# Patient Record
Sex: Male | Born: 1990 | Race: Black or African American | Hispanic: No | Smoking: Current every day smoker
Health system: Southern US, Community
[De-identification: ages and names within clinical notes are randomized; demographics above are authoritative.]

## PROBLEM LIST (undated history)

## (undated) DIAGNOSIS — K469 Unspecified abdominal hernia without obstruction or gangrene: Secondary | ICD-10-CM

## (undated) HISTORY — PX: INGUINAL HERNIA REPAIR: SUR1180

---
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
## 2009-02-19 ENCOUNTER — Emergency Department (HOSPITAL_COMMUNITY): Admission: EM | Admit: 2009-02-19 | Discharge: 2009-02-19 | Payer: Self-pay | Admitting: Emergency Medicine

## 2010-01-28 ENCOUNTER — Emergency Department (HOSPITAL_COMMUNITY): Admission: EM | Admit: 2010-01-28 | Discharge: 2010-01-28 | Payer: Self-pay | Admitting: Emergency Medicine

## 2010-11-09 LAB — RAPID STREP SCREEN (MED CTR MEBANE ONLY): Streptococcus, Group A Screen (Direct): NEGATIVE

## 2011-01-05 NOTE — Op Note (Signed)
NAMEARVILLE, POSTLEWAITE NO.:  [id number]   MEDICAL RECORD NO.:  [id number]          PATIENT TYPE:  AMB   LOCATION:  DSC                          FACILITY:  [facility]   PHYSICIAN:  Currie Paris, M.D.DATE OF BIRTH:  [birth year]   DATE OF PROCEDURE:  07/10/2008  DATE OF DISCHARGE:                               OPERATIVE REPORT   MEDICAL RECORD NUMBER [id number] [phone].   PREOPERATIVE DIAGNOSIS:  Possible myositis.   POSTOPERATIVE DIAGNOSIS:  Possible myositis.   OPERATION:  Left thigh (vastus lateralis), muscle biopsy.   SURGEON:  Currie Paris, MD   ANESTHESIA:  MAC.   CLINICAL HISTORY:  This is an 20 year old who is being evaluated for a  possible myositis.  He was requested to have a muscle biopsy to help  with diagnostic studies.   DESCRIPTION OF PROCEDURE:  The patient and his mother was seen in the  holding area and they had no further questions.  We identified and  marked the left thigh as the operative site.   The patient was taken to the operating room, and after satisfactory IV  sedation, the left thigh was prepped and draped as a sterile field.  The  time-out was done.   I infiltrated a combination of 1% Xylocaine mixed with 0.5% Marcaine  with epi into the skin and subcutaneous tissues but tried to not put any  deeper.  I made a longitudinal incision.  I identified the fascia and  opened this and then took 3 separate segments of muscle from the thigh.  Grossly, the muscle appeared normal.  These were all tacked to some  tongue blades and sent directly to pathology.  I had already spoken to  Dr. Clelia Croft prior to doing the biopsy.   I then put some more local into the muscle to help with postop pain  relief and closed with 2-0 Vicryl to close the fascia, 3-0 Vicryl subcu,  and 3-0 Monocryl subcuticular plus Dermabond.   The patient tolerated the procedure well and there were no  complications.      Currie Paris, M.D.  Electronically  Signed     CJS/MEDQ  D:  07/10/2008  T:  07/11/2008  Job:  [id number]   cc:   Dario Guardian, M.D.  Levert Feinstein, MD

## 2011-10-26 ENCOUNTER — Encounter (HOSPITAL_COMMUNITY): Payer: Self-pay | Admitting: *Deleted

## 2011-10-26 ENCOUNTER — Emergency Department (HOSPITAL_COMMUNITY)
Admission: EM | Admit: 2011-10-26 | Discharge: 2011-10-26 | Disposition: A | Discharge status: Home / Self Care | Payer: Self-pay | Attending: Emergency Medicine | Admitting: Emergency Medicine

## 2011-10-26 DIAGNOSIS — IMO0001 Reserved for inherently not codable concepts without codable children: Secondary | ICD-10-CM | POA: Insufficient documentation

## 2011-10-26 DIAGNOSIS — G40909 Epilepsy, unspecified, not intractable, without status epilepticus: Secondary | ICD-10-CM | POA: Insufficient documentation

## 2011-10-26 DIAGNOSIS — K0889 Other specified disorders of teeth and supporting structures: Secondary | ICD-10-CM

## 2011-10-26 DIAGNOSIS — K089 Disorder of teeth and supporting structures, unspecified: Secondary | ICD-10-CM | POA: Insufficient documentation

## 2011-10-26 DIAGNOSIS — G8929 Other chronic pain: Secondary | ICD-10-CM | POA: Insufficient documentation

## 2011-10-26 MED ORDER — BUPIVACAINE HCL (PF) 0.25 % IJ SOLN
5.0000 mL | Freq: Once | INTRAMUSCULAR | Status: AC
  Administered 2011-10-26: 5 mL
  Filled 2011-10-26: qty 30

## 2011-10-26 MED ORDER — HYDROCODONE-ACETAMINOPHEN 5-325 MG PO TABS: 1.0000 | ORAL_TABLET | ORAL | Status: AC | PRN

## 2011-10-26 NOTE — ED Notes (Signed)
Went in to d/c pt-pt's mother reports that the dentist's office cannot see pt until tomorrow am at 0900-she is requesting for pain med for pt "to get him through the night."

## 2011-10-26 NOTE — ED Provider Notes (Signed)
History     CSN: [id number]  Arrival date & time 10/26/11  1258   First MD Initiated Contact with Patient 10/26/11 1302      Chief Complaint  Patient presents with  . Dental Pain    (Consider location/radiation/quality/duration/timing/severity/associated sxs/prior treatment) HPI History provided by pt.   Pt has had severe, left lower toothache for several days, since a piece broke off.  Has vicodin at home for chronic myalgias/migraines and has had some relief with this medication.  No associated fever.  His mother would like a referral to the dentist.    History reviewed. No pertinent past medical history.  History reviewed. No pertinent past surgical history.  No family history on file.  History  Substance Use Topics  . Smoking status: Not on file  . Smokeless tobacco: Not on file  . Alcohol Use: No      Review of Systems  All other systems reviewed and are negative.    Allergies  Review of patient's allergies indicates not on file.  Home Medications  No current outpatient prescriptions on file.  There were no vitals taken for this visit.  Physical Exam  Nursing note and vitals reviewed. Constitutional: He is oriented to person, place, and time. He appears well-developed and well-nourished.  HENT:  Head: Normocephalic and atraumatic. No trismus in the jaw.  Mouth/Throat: Uvula is midline and oropharynx is clear and moist.       Left lower first molar w/ small posterior avulsion.  Non-tender.  Adjacent gingiva appears normal.  Good dentition.   Eyes:       Normal appearance  Neck: Normal range of motion. Neck supple.  Lymphadenopathy:    He has no cervical adenopathy.  Neurological: He is alert and oriented to person, place, and time.  Psychiatric: He has a normal mood and affect. His behavior is normal.    ED Course  Dental Date/Time: 10/26/2011 8:14 PM Performed by: Otilio Miu Authorized by: Ruby Cola E Consent: Verbal  consent obtained. Consent given by: patient and parent Patient understanding: patient states understanding of the procedure being performed Local anesthesia used: yes Local anesthetic: bupivacaine 0.25% without epinephrine Anesthetic total: 5 ml Patient tolerance: Patient tolerated the procedure well with no immediate complications. Comments: Apical block (left lower first molar)   (including critical care time)  Labs Reviewed - No data to display No results found.   1. Toothache       MDM  Pt presents w/ partial avulsion and pain of left lower first molar.  Low clinical suspicion for dental infection.  Apical block performed at patient's request.  Pt's mother called dentist on call from ED and is scheduled to be seen tomorrow at 9am.  D/c'd home w/ 5 vicodin to get him through until appt.          Otilio Miu, [place] 10/26/11 2020

## 2011-10-26 NOTE — Discharge Instructions (Signed)
Follow up with Dr. Melynda Ripple as soon as possible.   You may return to the ER if symptoms worsen or you have any other concerns.   Dental Pain A tooth ache may be caused by cavities (tooth decay). Cavities expose the nerve of the tooth to air and hot or cold temperatures. It may come from an infection or abscess (also called a boil or furuncle) around your tooth. It is also often caused by dental caries (tooth decay). This causes the pain you are having. DIAGNOSIS  Your caregiver can diagnose this problem by exam. TREATMENT   If caused by an infection, it may be treated with medications which kill germs (antibiotics) and pain medications as prescribed by your caregiver. Take medications as directed.   Only take over-the-counter or prescription medicines for pain, discomfort, or fever as directed by your caregiver.   Whether the tooth ache today is caused by infection or dental disease, you should see your dentist as soon as possible for further care.  SEEK MEDICAL CARE IF: The exam and treatment you received today has been provided on an emergency basis only. This is not a substitute for complete medical or dental care. If your problem worsens or new problems (symptoms) appear, and you are unable to meet with your dentist, call or return to this location. SEEK IMMEDIATE MEDICAL CARE IF:   You have a fever.   You develop redness and swelling of your face, jaw, or neck.   You are unable to open your mouth.   You have severe pain uncontrolled by pain medicine.  MAKE SURE YOU:   Understand these instructions.   Will watch your condition.   Will get help right away if you are not doing well or get worse.  Document Released: 08/09/2005 Document Revised: 07/29/2011 Document Reviewed: 03/27/2008 [facility] Patient Information 2012 [place], [place].

## 2011-10-26 NOTE — ED Notes (Signed)
Kim case mgr at bedside to speak to pt and  mother

## 2011-10-26 NOTE — ED Notes (Signed)
Pt's mother came to the desk, asking for ice d/t pt having "mouth" pain.  She went back in the room, then after a few minutes she comes back out of the room at the desk.  States "can i have that ice?"  She's made aware that per the ED's policy, pt are not allowed to have something to eat or drink until they are seen by a provider.  Then she goes out in to waiting area, and as she was walking out she said "well im just gonna go get him some water then."  At this time, pt came out of the room, states  "i don't need water i just need ice."  Explained to pt the ED's policy.  Pt's mother walked back in with a bottle of water.

## 2011-10-26 NOTE — ED Notes (Signed)
Pt here for dental pain.  Pt's mother reports that she was given a referral, called the office but was instructed that for the Dentist to see pt, that he has to come to the ED.

## 2011-10-27 NOTE — ED Provider Notes (Signed)
Medical screening examination/treatment/procedure(s) were performed by non-physician practitioner and as supervising physician I was immediately available for consultation/collaboration.  Doug Sou, MD 10/27/11 [phone]

## 2012-01-11 ENCOUNTER — Emergency Department (HOSPITAL_COMMUNITY): Payer: Self-pay

## 2012-01-11 ENCOUNTER — Emergency Department (HOSPITAL_COMMUNITY)
Admission: EM | Admit: 2012-01-11 | Discharge: 2012-01-11 | Disposition: A | Discharge status: Home / Self Care | Payer: Self-pay | Attending: Emergency Medicine | Admitting: Emergency Medicine

## 2012-01-11 ENCOUNTER — Encounter (HOSPITAL_COMMUNITY): Payer: Self-pay

## 2012-01-11 DIAGNOSIS — N453 Epididymo-orchitis: Secondary | ICD-10-CM | POA: Insufficient documentation

## 2012-01-11 DIAGNOSIS — N451 Epididymitis: Secondary | ICD-10-CM

## 2012-01-11 MED ORDER — DOXYCYCLINE HYCLATE 100 MG PO CAPS: 100.0000 mg | ORAL_CAPSULE | Freq: Two times a day (BID) | ORAL | Status: AC

## 2012-01-11 MED ORDER — IBUPROFEN 600 MG PO TABS: 600.0000 mg | ORAL_TABLET | Freq: Three times a day (TID) | ORAL | Status: AC | PRN

## 2012-01-11 MED ORDER — CEFTRIAXONE SODIUM 250 MG IJ SOLR
250.0000 mg | Freq: Once | INTRAMUSCULAR | Status: AC
  Administered 2012-01-11: 250 mg via INTRAMUSCULAR
  Filled 2012-01-11: qty 250

## 2012-01-11 MED ORDER — DOXYCYCLINE HYCLATE 100 MG PO TABS
100.0000 mg | ORAL_TABLET | Freq: Once | ORAL | Status: AC
  Administered 2012-01-11: 100 mg via ORAL
  Filled 2012-01-11: qty 1

## 2012-01-11 MED ORDER — LIDOCAINE HCL 1 % IJ SOLN
INTRAMUSCULAR | Status: AC
  Administered 2012-01-11: 16:00:00
  Filled 2012-01-11: qty 20

## 2012-01-11 MED ORDER — IBUPROFEN 200 MG PO TABS
600.0000 mg | ORAL_TABLET | Freq: Once | ORAL | Status: AC
  Administered 2012-01-11: 600 mg via ORAL
  Filled 2012-01-11: qty 3

## 2012-01-11 NOTE — ED Notes (Signed)
Patient transported to Ultrasound 

## 2012-01-11 NOTE — ED Notes (Signed)
Pt presents with no acute distress.  Pt reports sitting on right testicle wrong and then several days later a bump appears "on the inside. Very hard" denies N/V/D and fever

## 2012-01-11 NOTE — ED Provider Notes (Signed)
History     CSN: [id number]  Arrival date & time 01/11/12  1329   First MD Initiated Contact with Patient 01/11/12 1332      Chief Complaint  Patient presents with  . Testicle Pain     The history is provided by the patient.   the patient reports several days of right-sided testicular tenderness and pain.  He thinks he sat on his testicle and it began hurting after this.  He denies discharge from his penis.  He denies nausea vomiting or diarrhea.  He has had no new lesions on his testicles or penis.  Denies new sexual contacts. No dysuria. No fever or chills. Pain is mild.    History reviewed. No pertinent past medical history.  History reviewed. No pertinent past surgical history.  No family history on file.  History  Substance Use Topics  . Smoking status: Not on file  . Smokeless tobacco: Not on file  . Alcohol Use: No      Review of Systems  All other systems reviewed and are negative.    Allergies  Bee venom  Home Medications   Current Outpatient Rx  Name Route Sig Dispense Refill  . EPINEPHRINE 0.3 MG/0.3ML IJ DEVI Intramuscular Inject 0.3 mg into the muscle once.    Marland Kitchen DOXYCYCLINE HYCLATE 100 MG PO CAPS Oral Take 1 capsule (100 mg total) by mouth 2 (two) times daily. 20 capsule 0  . IBUPROFEN 600 MG PO TABS Oral Take 1 tablet (600 mg total) by mouth every 8 (eight) hours as needed for pain. 15 tablet 0    BP 114/58  Pulse 58  Temp(Src) 98.4 F (36.9 C) (Oral)  Resp 16  Ht 5\' 5"  (1.651 m)  Wt 147 lb (66.679 kg)  BMI 24.46 kg/m2  SpO2 100%  Physical Exam  Nursing note and vitals reviewed. Constitutional: He is oriented to person, place, and time. He appears well-developed and well-nourished.  HENT:  Head: Normocephalic and atraumatic.  Eyes: EOM are normal.  Neck: Normal range of motion.  Cardiovascular: Normal rate, regular rhythm, normal heart sounds and intact distal pulses.   Pulmonary/Chest: Effort normal and breath sounds normal. No  respiratory distress.  Abdominal: Soft. He exhibits no distension. There is no tenderness.  Genitourinary:       Normal circumcised penis.  Normal lie of his testicles bilaterally.  Scrotum is normal without erythema or tenderness.  There does appear to be some tenderness of his right testicle with tenderness at the inferior posterior aspect of the testicle.  No left-sided testicular tenderness  Musculoskeletal: Normal range of motion.  Neurological: He is alert and oriented to person, place, and time.  Skin: Skin is warm and dry.  Psychiatric: He has a normal mood and affect. Judgment normal.    ED Course  Procedures (including critical care time)  Labs Reviewed - No data to display US Scrotum  01/11/2012  *RADIOLOGY REPORT*  Clinical Data:  Testicular pain after sitting on his testicles 3 days ago.  Evaluate for potential portion.  SCROTAL ULTRASOUND DOPPLER ULTRASOUND OF THE TESTICLES  Technique: Complete ultrasound examination of the testicles, epididymis, and other scrotal structures was performed.  Color and spectral Doppler ultrasound were also utilized to evaluate blood flow to the testicles.  Comparison:  No priors.  Findings:  Right testis:  4.8 x 2.3 x 3.1 cm demonstrating normal echotexture and appearance, with normal flow on color Doppler imaging.  Left testis:  4.8 x 2.0 x 3.8 cm demonstrating  normal echotexture and appearance, with normal flow on color Doppler imaging.  Right epididymis:  Mild diffuse prominence, however, the talus focally enlarged and hyperemic.  This focal enlargement of the tail of the right epididymis corresponds with the patients palpable abnormality and the site of patient's acute pain.  Left epididymis:  Normal in size and appearance.  Hydocele:  Trace bilateral.  Varicocele:  Absent.  Pulsed Doppler interrogation of both testes demonstrates low resistance flow bilaterally.  IMPRESSION: 1.  Findings, as above, most consistent with right-sided epididymitis  involving predominately the tail of the right epididymis. 2.  Trace bilateral hydroceles.  Original Report Authenticated By: Florencia Reasons, M.D.   [place] Art/ven Flow Abd Pelv Doppler  01/11/2012  *RADIOLOGY REPORT*  Clinical Data:  Testicular pain after sitting on his testicles 3 days ago.  Evaluate for potential portion.  SCROTAL ULTRASOUND DOPPLER ULTRASOUND OF THE TESTICLES  Technique: Complete ultrasound examination of the testicles, epididymis, and other scrotal structures was performed.  Color and spectral Doppler ultrasound were also utilized to evaluate blood flow to the testicles.  Comparison:  No priors.  Findings:  Right testis:  4.8 x 2.3 x 3.1 cm demonstrating normal echotexture and appearance, with normal flow on color Doppler imaging.  Left testis:  4.8 x 2.0 x 3.8 cm demonstrating normal echotexture and appearance, with normal flow on color Doppler imaging.  Right epididymis:  Mild diffuse prominence, however, the talus focally enlarged and hyperemic.  This focal enlargement of the tail of the right epididymis corresponds with the patients palpable abnormality and the site of patient's acute pain.  Left epididymis:  Normal in size and appearance.  Hydocele:  Trace bilateral.  Varicocele:  Absent.  Pulsed Doppler interrogation of both testes demonstrates low resistance flow bilaterally.  IMPRESSION: 1.  Findings, as above, most consistent with right-sided epididymitis involving predominately the tail of the right epididymis. 2.  Trace bilateral hydroceles.  Original Report Authenticated By: Florencia Reasons, M.D.     1. Epididymitis       MDM  Ultrasound consistent with epididymitis.  Patient's been given Rocephin and Doxy.  He's been informed to followup at the health department for additional STD screening.  She's been informed to not have intercourse with anyone until all of his sexual partners evaluated and treated and is suspicious antibiotics.  He is encouraged use condoms for  protection        Lyanne Co, MD 01/11/12 [phone]

## 2012-01-11 NOTE — Discharge Instructions (Signed)
Epididymitis  Epididymitis is a swelling (inflammation) of the epididymis. The epididymis is a cord-like structure along the back part of the testicle. Epididymitis is usually, but not always, caused by infection. This is usually a sudden problem beginning with chills, fever and pain behind the scrotum and in the testicle. There may be swelling and redness of the testicle.  DIAGNOSIS   Physical examination will reveal a tender, swollen epididymis. Sometimes, cultures are obtained from the urine or from prostate secretions to help find out if there is an infection or if the cause is a different problem. Sometimes, blood work is performed to see if your white blood cell count is elevated and if a germ (bacterial) or viral infection is present. Using this knowledge, an appropriate medicine which kills germs (antibiotic) can be chosen by your caregiver. A viral infection causing epididymitis will most often go away (resolve) without treatment.  HOME CARE INSTRUCTIONS    Hot sitz baths for 20 minutes, 4 times per day, may help relieve pain.   Only take over-the-counter or prescription medicines for pain, discomfort or fever as directed by your caregiver.   Take all medicines, including antibiotics, as directed. Take the antibiotics for the full prescribed length of time even if you are feeling better.   It is very important to keep all follow-up appointments.  SEEK IMMEDIATE MEDICAL CARE IF:    You have a fever.   You have pain not relieved with medicines.   You have any worsening of your problems.   Your pain seems to come and go.   You develop pain, redness, and swelling in the scrotum and surrounding areas.  MAKE SURE YOU:    Understand these instructions.   Will watch your condition.   Will get help right away if you are not doing well or get worse.  Document Released: 08/06/2000 Document Revised: 07/29/2011 Document Reviewed: 06/26/2009  [employer] Patient Information 2012 [employer].

## 2012-01-11 NOTE — ED Notes (Signed)
Returned from U/S

## 2012-02-17 ENCOUNTER — Emergency Department (HOSPITAL_COMMUNITY): Payer: Self-pay

## 2012-02-17 ENCOUNTER — Emergency Department (HOSPITAL_COMMUNITY)
Admission: EM | Admit: 2012-02-17 | Discharge: 2012-02-17 | Disposition: A | Discharge status: Home / Self Care | Payer: Self-pay | Attending: Emergency Medicine | Admitting: Emergency Medicine

## 2012-02-17 ENCOUNTER — Encounter (HOSPITAL_COMMUNITY): Payer: Self-pay | Admitting: Emergency Medicine

## 2012-02-17 DIAGNOSIS — N451 Epididymitis: Secondary | ICD-10-CM

## 2012-02-17 DIAGNOSIS — N453 Epididymo-orchitis: Secondary | ICD-10-CM | POA: Insufficient documentation

## 2012-02-17 DIAGNOSIS — N509 Disorder of male genital organs, unspecified: Secondary | ICD-10-CM | POA: Insufficient documentation

## 2012-02-17 HISTORY — DX: Unspecified abdominal hernia without obstruction or gangrene: K46.9

## 2012-02-17 LAB — URINALYSIS, ROUTINE W REFLEX MICROSCOPIC
Bilirubin Urine: NEGATIVE
Glucose, UA: NEGATIVE mg/dL
Hgb urine dipstick: NEGATIVE
Ketones, ur: NEGATIVE mg/dL
Nitrite: NEGATIVE
Protein, ur: NEGATIVE mg/dL
Specific Gravity, Urine: 1.028 (ref 1.005–1.030)
Urobilinogen, UA: 1 mg/dL (ref 0.0–1.0)
pH: 6 (ref 5.0–8.0)

## 2012-02-17 LAB — URINE MICROSCOPIC-ADD ON

## 2012-02-17 MED ORDER — LIDOCAINE HCL 1 % IJ SOLN
INTRAMUSCULAR | Status: AC
  Filled 2012-02-17: qty 20

## 2012-02-17 MED ORDER — CEFTRIAXONE SODIUM 250 MG IJ SOLR
250.0000 mg | Freq: Once | INTRAMUSCULAR | Status: AC
  Administered 2012-02-17: 250 mg via INTRAMUSCULAR
  Filled 2012-02-17: qty 250

## 2012-02-17 MED ORDER — DOXYCYCLINE HYCLATE 100 MG PO CAPS: 100.0000 mg | ORAL_CAPSULE | Freq: Two times a day (BID) | ORAL | Status: AC

## 2012-02-17 NOTE — ED Notes (Signed)
2 day hx of swollen r/testicle. Similar incident 1 month ago

## 2012-02-17 NOTE — Discharge Instructions (Signed)
Epididymitis Epididymitis is a swelling (inflammation) of the epididymis. The epididymis is a cord-like structure along the back part of the testicle. Epididymitis is usually, but not always, caused by infection. This is usually a sudden problem beginning with chills, fever and pain behind the scrotum and in the testicle. There may be swelling and redness of the testicle. DIAGNOSIS  Physical examination will reveal a tender, swollen epididymis. Sometimes, cultures are obtained from the urine or from prostate secretions to help find out if there is an infection or if the cause is a different problem. Sometimes, blood work is performed to see if your white blood cell count is elevated and if a germ (bacterial) or viral infection is present. Using this knowledge, an appropriate medicine which kills germs (antibiotic) can be chosen by your caregiver. A viral infection causing epididymitis will most often go away (resolve) without treatment. HOME CARE INSTRUCTIONS   Hot sitz baths for 20 minutes, 4 times per day, may help relieve pain.   Only take over-the-counter or prescription medicines for pain, discomfort or fever as directed by your caregiver.   Take all medicines, including antibiotics, as directed. Take the antibiotics for the full prescribed length of time even if you are feeling better.   It is very important to keep all follow-up appointments.  SEEK IMMEDIATE MEDICAL CARE IF:   You have a fever.   You have pain not relieved with medicines.   You have any worsening of your problems.   Your pain seems to come and go.   You develop pain, redness, and swelling in the scrotum and surrounding areas.   Testicular Masses Most testicular masses, such as a growth or a swelling, are benign. This means they are not cancerous. Common types of testicular masses include:   Hydrocele is the most common benign testicular mass in an adult. Hydroceles are generally soft, painless scrotal swellings  that are collections of fluid in the scrotal sac. These can rapidly change size as the fluid enters or leaves.   Spermatoceles are generally soft, painless, benign swellings that are cyst-like masses in the scrotum containing fluid. They can rapidly change size as the fluid enters or leaves. They are more prominent while standing or exercising. Sometimes, spermatoceles may cause a sensation of heaviness or a dull ache.   Varicocele is an enlargement of the veins that drain the testicles. This condition can increase the risk of infertility. They are more prominent while standing or exercising. Sometimes, varicoceles may cause a sensation of heaviness or a dull ache.   Inguinal hernia is a bulge caused by a portion of intestine protruding into the scrotum through a weak area in the abdominal muscles. Hernias may or may not be painful. They are soft and usually enlarge with coughing or straining.   Torsion of the testis can cause a testicular mass that develops quickly and is associated with tenderness and/or fever. This is caused by a twisting of the testicle within the sac. It also reduces the blood supply and can destroy the testis if not treated quickly with surgery.   Epididymitis is inflammation of the epididymis (a structure attached to the testicle), usually caused by a sexually transmitted infection or a urinary tract infection. This generally shows up as testicular discomfort and swelling, and may include pain during urination.   Testicular appendages are remnants of tissue on the testis present since birth. A testicular appendage can twist on its blood supply and cause pain. In most cases, this is  seen as a blue dot on the scrotum.  A cancerous growth in the scrotum may first appear as a swelling. There may or may not be pain. The growth usually feels firm and shows up as a growth on the testicle. Any solid, firm growth in a testicle is considered cancer until proven otherwise. Cancer of the  testicle most commonly affects men 85 to 22 years old. Risk factors include prior testicular tumor and cryptorchidism (undescended testis). Occasionally, testicular cancer may appear with symptoms (problems) of metastasis. This means the tumor (abnormal growth) has spread and is causing other problems that may include cough, shortness of breath or weight loss. Monthly testicular self-exams are recommended for all men. Get in the habit of examining your own testicles. A good time is while taking a shower. Get to know what your testicles feel like so you will know if there is a new growth or change in them. DIAGNOSIS  See your caregiver if you feel a growth in your testicle. Sometimes, all that is needed to make the diagnosis (determine what is wrong) is a physical exam. Your caregiver may shine a bright light through the scrotum to help make the diagnosis. This is called transillumination. The light will shine easily through a collection of fluid but will usually not shine through a tumor. Other testing, including blood tests and an ultrasound exam, may be done. An ultrasound exam bounces harmless sound waves off the testicles and produces a black and white picture almost like that produced by a camera. Diagnosis of testicular cancer can be made by measuring several substances in the blood, called markers), that may indicate the presence of certain cancers. TREATMENT  What is wrong determines how it is treated. Small hydroceles and spermatoceles often require no treatment. In some cases, however, they may be treated surgically. Hernias are repaired with surgery. Because epididymitis is usually caused by an infection, it is usually treated with antibiotics. Varicoceles may be treated by surgery to tie off the affected veins. Testicular cancer treatment depends upon the type of cancer. Sometimes, some tissue is removed surgically as a way of trying to preserve the testicle but if a tumor is suspected, the preferred  treatment is removal of the entire testicle. Further treatment may include watching the growth with strict follow-up, chemotherapy or radiation. If a growth has been found in a testicle, your caregiver will help you determine the best treatment. Document Released: 02/13/2003 Document Revised: 07/29/2011 Document Reviewed: 08/09/2005 [facility] Patient Information 2012 [place], [place].  Understand these instructions.   Will watch your condition.   Will get help right away if you are not doing well or get worse.  Document Released: 08/06/2000 Document Revised: 07/29/2011 Document Reviewed: 06/26/2009 [facility] Patient Information 2012 [place], [place].

## 2012-02-17 NOTE — ED Provider Notes (Signed)
History    21yM with R testicular pain and mass. Evaluated 5/24 and had [place] consistent with R epididymitis. Per that note was tx'd with ceftriaxone and given prescription for doxy. Pt says never got rocephin and didn't fill doxycycline because could afford it. Intermittent R testicular pain and swelling since. No abdominal or back pain. No discharge. No urinary complaints. Pt says was at [facility] today and they told him it wasn't an STD and to come to ER. Mother at bedside and concerned about multiple. Apparently had R inguinal hernia repair as child and concerned about re-occurence. Concerned about possible malignancy. "It ain't no STD because they just test him and told him it wasn't."    CSN: [id number]  Arrival date & time 02/17/12  1356   First MD Initiated Contact with Patient 02/17/12 1458      Chief Complaint  Patient presents with  . Groin Swelling    pt was seen in  [facility] department clinic this am and refered to ED for swollen right testicle  . Testicle Pain    (Consider location/radiation/quality/duration/timing/severity/associated sxs/prior treatment) HPI  Past Medical History  Diagnosis Date  . Hernia     Past Surgical History  Procedure Date  . Inguinal hernia repair     Family History  Problem Relation Age of Onset  . Diabetes Father   . Hypertension Father     History  Substance Use Topics  . Smoking status: Not on file  . Smokeless tobacco: Not on file  . Alcohol Use: No      Review of Systems   Review of symptoms negative unless otherwise noted in HPI.   Allergies  Bee venom  Home Medications   Current Outpatient Rx  Name Route Sig Dispense Refill  . FLUTICASONE PROPIONATE 50 MCG/ACT NA SUSP Nasal Place 2 sprays into the nose daily.    Marland Kitchen HYDROCODONE-ACETAMINOPHEN 7.5-325 MG PO TABS Oral Take 1 tablet by mouth every 6 (six) hours as needed.    . OLOPATADINE HCL 0.1 % OP SOLN Both Eyes Place 1 drop into both eyes 2  (two) times daily.    Marland Kitchen EPINEPHRINE 0.3 MG/0.3ML IJ DEVI Intramuscular Inject 0.3 mg into the muscle once.      BP 124/69  Pulse 70  Temp 98.2 F (36.8 C) (Oral)  Resp 18  SpO2 100%  Physical Exam  Nursing note and vitals reviewed. Constitutional: He appears well-developed and well-nourished. No distress.  HENT:  Head: Normocephalic and atraumatic.  Eyes: Conjunctivae are normal. Right eye exhibits no discharge. Left eye exhibits no discharge.  Neck: Neck supple.  Cardiovascular: Normal rate, regular rhythm and normal heart sounds.  Exam reveals no gallop and no friction rub.   No murmur heard. Pulmonary/Chest: Effort normal and breath sounds normal. No respiratory distress.  Abdominal: Soft. He exhibits no distension. There is no tenderness.  Genitourinary:       Normal external male genitalia. No skin lesions noted. No inguinal adenopathy. No discharge. Normal lie of testicles. R testicle with 1.5cm firm, mildly tender mass inferior aspect. No scrotal edema. No inguinal hernia b/l.  Musculoskeletal: He exhibits no edema and no tenderness.  Neurological: He is alert.  Skin: Skin is warm and dry.  Psychiatric: He has a normal mood and affect. His behavior is normal. Thought content normal.    ED Course  Procedures (including critical care time)  Labs Reviewed - No data to display No results found.   1. Epididymitis, right  MDM  21yM with mildy painful R testicular mass. Declining STD testing because "they just did that." More than likely persistent untreated epididymitis. Will [place] again more or less to appease mother and pt.    4:50 PM [place] again consistent with epididymitis. Pt provided with coupon for 2w supply of doxycycline at [facility] for $19.08. Dose ceftriaxone given.        Raeford Razor, MD 02/17/12 [phone]

## 2013-07-14 ENCOUNTER — Emergency Department (HOSPITAL_COMMUNITY)
Admission: EM | Admit: 2013-07-14 | Discharge: 2013-07-14 | Disposition: A | Discharge status: Home / Self Care | Payer: Self-pay | Attending: Emergency Medicine | Admitting: Emergency Medicine

## 2013-07-14 ENCOUNTER — Emergency Department (HOSPITAL_COMMUNITY): Payer: Self-pay

## 2013-07-14 ENCOUNTER — Encounter (HOSPITAL_COMMUNITY): Payer: Self-pay | Admitting: Emergency Medicine

## 2013-07-14 DIAGNOSIS — Y9361 Activity, american tackle football: Secondary | ICD-10-CM | POA: Insufficient documentation

## 2013-07-14 DIAGNOSIS — Y9239 Other specified sports and athletic area as the place of occurrence of the external cause: Secondary | ICD-10-CM | POA: Insufficient documentation

## 2013-07-14 DIAGNOSIS — S4980XA Other specified injuries of shoulder and upper arm, unspecified arm, initial encounter: Secondary | ICD-10-CM | POA: Insufficient documentation

## 2013-07-14 DIAGNOSIS — IMO0002 Reserved for concepts with insufficient information to code with codable children: Secondary | ICD-10-CM | POA: Insufficient documentation

## 2013-07-14 DIAGNOSIS — Z8719 Personal history of other diseases of the digestive system: Secondary | ICD-10-CM | POA: Insufficient documentation

## 2013-07-14 DIAGNOSIS — Z79899 Other long term (current) drug therapy: Secondary | ICD-10-CM | POA: Insufficient documentation

## 2013-07-14 DIAGNOSIS — S4991XA Unspecified injury of right shoulder and upper arm, initial encounter: Secondary | ICD-10-CM

## 2013-07-14 DIAGNOSIS — R296 Repeated falls: Secondary | ICD-10-CM | POA: Insufficient documentation

## 2013-07-14 DIAGNOSIS — S46909A Unspecified injury of unspecified muscle, fascia and tendon at shoulder and upper arm level, unspecified arm, initial encounter: Secondary | ICD-10-CM | POA: Insufficient documentation

## 2013-07-14 NOTE — ED Notes (Signed)
Pt. Stated, I was playing football and I fell on my rt. Shoulder and its still hurting and sits higher than my other shoulder.

## 2013-07-14 NOTE — ED Provider Notes (Signed)
CSN: [id number]     Arrival date & time 07/14/13  1140 History  This chart was scribed for non-physician practitioner, Raymon Mutton, PA-C working with Donnetta Hutching, MD by Greggory Stallion, ED scribe. This patient was seen in room TR05C/TR05C and the patient's care was started at 1:40 PM.   Chief Complaint  Patient presents with  . Shoulder Injury   The history is provided by the patient. No language interpreter was used.   HPI Comments: Aaron Vaughn is a 22 y.o. male who presents to the Emergency Department complaining of right shoulder injury that occurred 06/03/13. He states he was playing football and fell directly onto his right shoulder. Pt had sudden onset, constant right shoulder pain for a few weeks after the incident occurred. He now only has pain with ROM and certain movements. Denies neck pain, neck stiffness, headache, dizziness, numbness, weakness, loss of sensation, tingling. Pt denies prior injury to right shoulder.   Past Medical History  Diagnosis Date  . Hernia    Past Surgical History  Procedure Laterality Date  . Inguinal hernia repair     Family History  Problem Relation Age of Onset  . Diabetes Father   . Hypertension Father    History  Substance Use Topics  . Smoking status: Not on file  . Smokeless tobacco: Not on file  . Alcohol Use: No    Review of Systems  Musculoskeletal: Positive for arthralgias. Negative for neck pain and neck stiffness.  Neurological: Negative for dizziness, weakness, numbness and headaches.  All other systems reviewed and are negative.   Allergies  Bee venom  Home Medications   Current Outpatient Rx  Name  Route  Sig  Dispense  Refill  . EPINEPHrine (EPIPEN) 0.3 mg/0.3 mL DEVI   Intramuscular   Inject 0.3 mg into the muscle once.         . fluticasone (FLONASE) 50 MCG/ACT nasal spray   Nasal   Place 2 sprays into the nose daily.         Marland Kitchen HYDROcodone-acetaminophen (NORCO) 7.5-325 MG per tablet   Oral    Take 1 tablet by mouth every 6 (six) hours as needed.         Marland Kitchen olopatadine (PATANOL) 0.1 % ophthalmic solution   Both Eyes   Place 1 drop into both eyes 2 (two) times daily.          BP 135/54  Pulse 55  Temp(Src) 98.4 F (36.9 C) (Oral)  Resp 18  Wt 140 lb 1.6 oz (63.549 kg)  SpO2 100%  Physical Exam  Nursing note and vitals reviewed. Constitutional: He is oriented to person, place, and time. He appears well-developed and well-nourished. No distress.  HENT:  Head: Normocephalic and atraumatic.  Eyes: EOM are normal. Right eye exhibits no discharge. Left eye exhibits no discharge.  Neck: Normal range of motion. Neck supple. No tracheal deviation present.  Negative neck stiffness Negative nuchal rigidity Negative C-spine tenderness  Cardiovascular: Normal rate, regular rhythm and normal heart sounds.  Exam reveals no friction rub.   No murmur heard. Pulses:      Radial pulses are 2+ on the right side, and 2+ on the left side.  Pulmonary/Chest: Effort normal and breath sounds normal. No respiratory distress. He has no wheezes. He has no rales.  Musculoskeletal: Normal range of motion. He exhibits no tenderness.  Negative swelling, erythema, inflammation, deformities, sunken in appearance noted to the right shoulder. Negative tenting of the clavicle of the  right side. Full range of motion to the right shoulder-abduction, abduction, extension, flexion, inversion, eversion. Negative drop arm. Negative apprehension.  Neurological: He is alert and oriented to person, place, and time. He exhibits normal muscle tone. Coordination normal.  Strength 5+/5+ upper extremities bilaterally with resistance applied, equal distribution noted  Skin: Skin is warm and dry.  Psychiatric: He has a normal mood and affect. His behavior is normal.    ED Course  Procedures (including critical care time)  DIAGNOSTIC STUDIES: Oxygen Saturation is 100% on RA, normal by my interpretation.     COORDINATION OF CARE: 1:45 PM-Discussed treatment plan which includes orthopedic referral and icy hot ointment with pt at bedside and pt agreed to plan.   Labs Review Labs Reviewed - No data to display Imaging Review Dg Shoulder Right  07/14/2013   CLINICAL DATA:  Shoulder pain  EXAM: RIGHT SHOULDER - 2+ VIEW  COMPARISON:  None.  FINDINGS: There is no evidence of fracture or dislocation. There is no evidence of arthropathy or other focal bone abnormality. Soft tissues are unremarkable.  IMPRESSION: Negative.   Electronically Signed   By: Salome Holmes M.D.   On: 07/14/2013 13:07    EKG Interpretation   None       MDM   1. Shoulder injury, right, initial encounter     Filed Vitals:   07/14/13 1143  BP: 135/54  Pulse: 55  Temp: 98.4 F (36.9 C)  TempSrc: Oral  Resp: 18  Weight: 140 lb 1.6 oz (63.549 kg)  SpO2: 100%    I personally performed the services described in this documentation, which was scribed in my presence. The recorded information has been reviewed and is accurate.  Patient presenting to emergency part with shoulder pain does been ongoing since 06/03/2013. Patient reports that this moment he does not have any shoulder pain, reported that he feels better-reported that he just wanted to get a checkup to make sure everything was fine and that the shoulder was in place. Alert and oriented. GCS 15. Negative deformities, erythema, swelling, ecchymosis, sunken in appearance noted to the right shoulder. Negative tenting of the right clavicle. Full range of motion to right shoulder noted without any difficulty. Strength intact. Radial pulses 2+ bilaterally. Negative drop arm. Negative apprehension. Right shoulder negative for fracture dislocation. Suspicion to be discomfort secondary to injury, negative findings for injury and dislocation. Patient stable, afebrile. Discharged patient. Referred patient orthopedic surgery. Discussed with patient to closely monitor symptoms  and if symptoms are to worsen or change report back to emergency part with - strict return structures given. Patient agreed to plan of care, understood, all questions answered.   Raymon Mutton, PA-C 07/16/13 1450

## 2013-07-14 NOTE — ED Notes (Signed)
Pt states he injured right shoulder while playing football on 06/03/13. States right shoulder "keeps popping" and hurts with movement.

## 2013-07-17 NOTE — ED Provider Notes (Signed)
Medical screening examination/treatment/procedure(s) were performed by non-physician practitioner and as supervising physician I was immediately available for consultation/collaboration.  EKG Interpretation   None        Donnetta Hutching, MD 07/17/13 2044

## 2015-03-16 ENCOUNTER — Encounter (HOSPITAL_COMMUNITY): Payer: Self-pay

## 2015-03-16 ENCOUNTER — Emergency Department (HOSPITAL_COMMUNITY): Payer: Self-pay

## 2015-03-16 ENCOUNTER — Emergency Department (HOSPITAL_COMMUNITY)
Admission: EM | Admit: 2015-03-16 | Discharge: 2015-03-16 | Disposition: A | Discharge status: Home / Self Care | Payer: Self-pay | Attending: Emergency Medicine | Admitting: Emergency Medicine

## 2015-03-16 DIAGNOSIS — Z720 Tobacco use: Secondary | ICD-10-CM | POA: Insufficient documentation

## 2015-03-16 DIAGNOSIS — Y9339 Activity, other involving climbing, rappelling and jumping off: Secondary | ICD-10-CM | POA: Insufficient documentation

## 2015-03-16 DIAGNOSIS — Y998 Other external cause status: Secondary | ICD-10-CM | POA: Insufficient documentation

## 2015-03-16 DIAGNOSIS — S63619A Unspecified sprain of unspecified finger, initial encounter: Secondary | ICD-10-CM

## 2015-03-16 DIAGNOSIS — S63616A Unspecified sprain of right little finger, initial encounter: Secondary | ICD-10-CM | POA: Insufficient documentation

## 2015-03-16 DIAGNOSIS — Y92828 Other wilderness area as the place of occurrence of the external cause: Secondary | ICD-10-CM | POA: Insufficient documentation

## 2015-03-16 DIAGNOSIS — Z8719 Personal history of other diseases of the digestive system: Secondary | ICD-10-CM | POA: Insufficient documentation

## 2015-03-16 DIAGNOSIS — W010XXA Fall on same level from slipping, tripping and stumbling without subsequent striking against object, initial encounter: Secondary | ICD-10-CM | POA: Insufficient documentation

## 2015-03-16 DIAGNOSIS — Z79899 Other long term (current) drug therapy: Secondary | ICD-10-CM | POA: Insufficient documentation

## 2015-03-16 DIAGNOSIS — Z7951 Long term (current) use of inhaled steroids: Secondary | ICD-10-CM | POA: Insufficient documentation
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
## 2015-03-16 NOTE — Discharge Instructions (Signed)
Finger Sprain  A finger sprain is a tear in one of the strong, fibrous tissues that connect the bones (ligaments) in your finger. The severity of the sprain depends on how much of the ligament is torn. The tear can be either partial or complete.  CAUSES   Often, sprains are a result of a fall or accident. If you extend your hands to catch an object or to protect yourself, the force of the impact causes the fibers of your ligament to stretch too much. This excess tension causes the fibers of your ligament to tear.  SYMPTOMS   You may have some loss of motion in your finger. Other symptoms include:   Bruising.   Tenderness.   Swelling.  DIAGNOSIS   In order to diagnose finger sprain, your caregiver will physically examine your finger or thumb to determine how torn the ligament is. Your caregiver may also suggest an X-ray exam of your finger to make sure no bones are broken.  TREATMENT   If your ligament is only partially torn, treatment usually involves keeping the finger in a fixed position (immobilization) for a short period. To do this, your caregiver will apply a bandage, cast, or splint to keep your finger from moving until it heals. For a partially torn ligament, the healing process usually takes 2 to 3 weeks.  If your ligament is completely torn, you may need surgery to reconnect the ligament to the bone. After surgery a cast or splint will be applied and will need to stay on your finger or thumb for 4 to 6 weeks while your ligament heals.  HOME CARE INSTRUCTIONS   Keep your injured finger elevated, when possible, to decrease swelling.   To ease pain and swelling, apply ice to your joint twice a day, for 2 to 3 days:   Put ice in a plastic bag.   Place a towel between your skin and the bag.   Leave the ice on for 15 minutes.   Only take over-the-counter or prescription medicine for pain as directed by your caregiver.   Do not wear rings on your injured finger.   Do not leave your finger unprotected  until pain and stiffness go away (usually 3 to 4 weeks).   Do not allow your cast or splint to get wet. Cover your cast or splint with a plastic bag when you shower or bathe. Do not swim.   Your caregiver may suggest special exercises for you to do during your recovery to prevent or limit permanent stiffness.  SEEK IMMEDIATE MEDICAL CARE IF:   Your cast or splint becomes damaged.   Your pain becomes worse rather than better.  MAKE SURE YOU:   Understand these instructions.   Will watch your condition.   Will get help right away if you are not doing well or get worse.  Document Released: 09/16/2004 Document Revised: 11/01/2011 Document Reviewed: 04/12/2011  Exit[employer] Patient Information 2015 [employer]. This information is not intended to replace advice given to you by your health care provider. Make sure you discuss any questions you have with your health care provider.

## 2015-03-16 NOTE — ED Notes (Signed)
Pt presents with c/o right pinky injury after tripping over the dogs and falling on that hand.

## 2016-01-22 IMAGING — CR DG HAND COMPLETE 3+V*R*
3 series · 3 of 3 positions shown · non-contrast
Comparison: None.

CLINICAL DATA: Small finger injury.  Pain.

EXAM:
RIGHT HAND - COMPLETE 3+ VIEW

[x hand pa right]
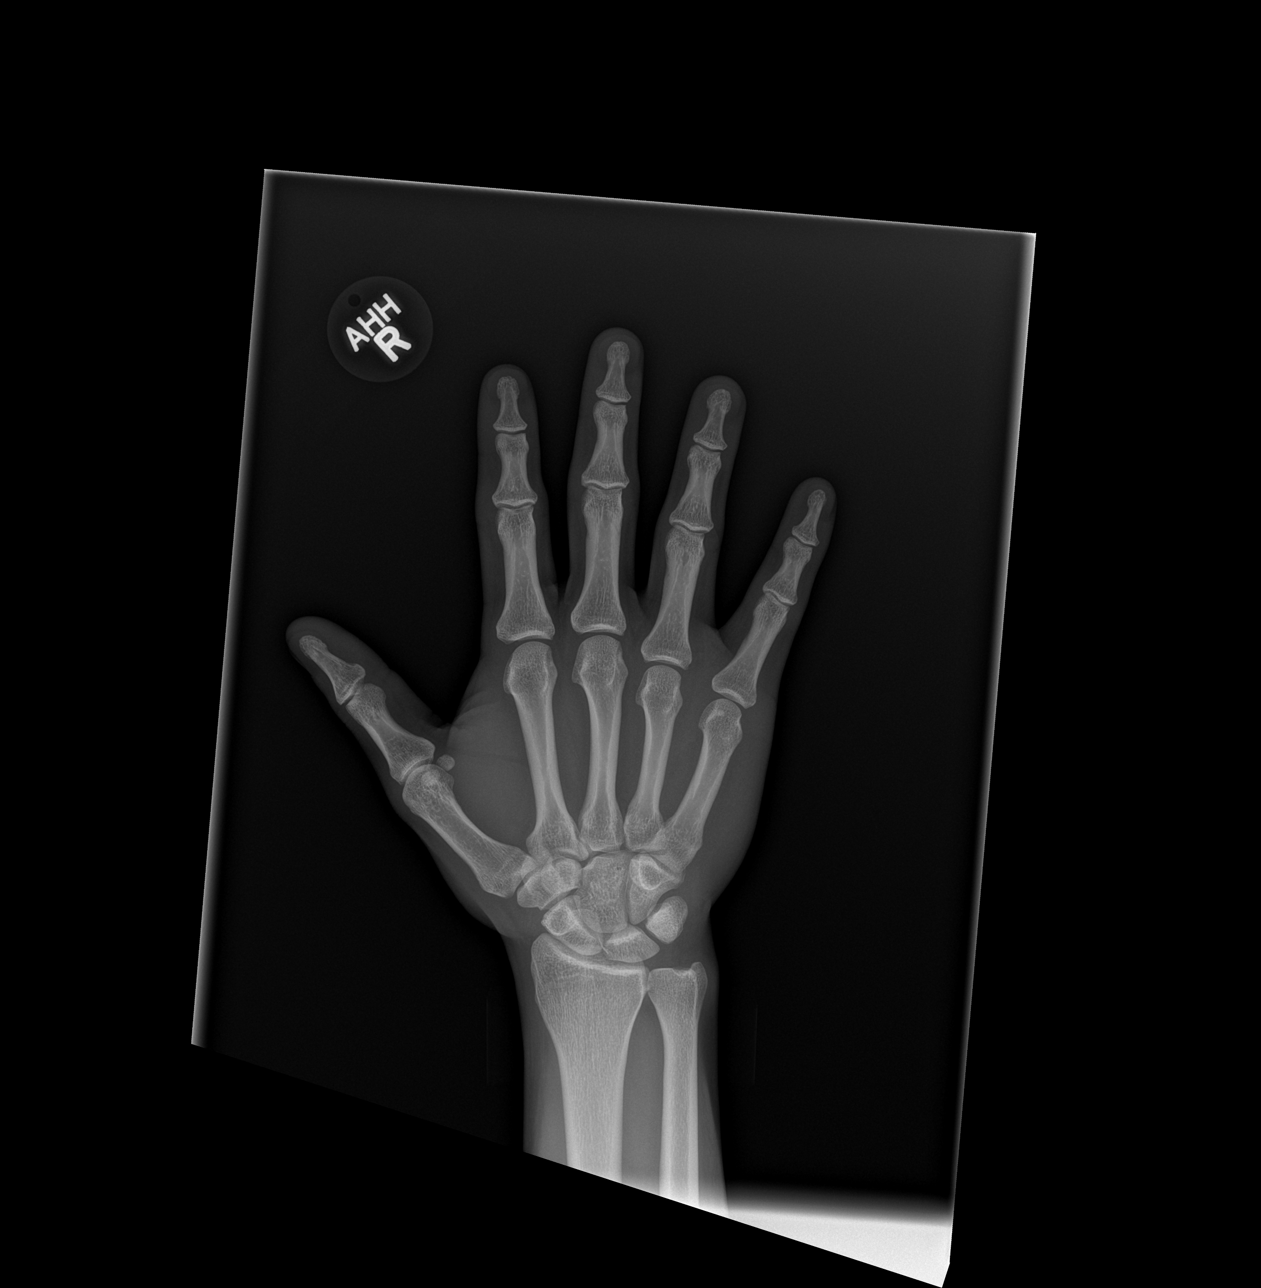

[x hand obl right]
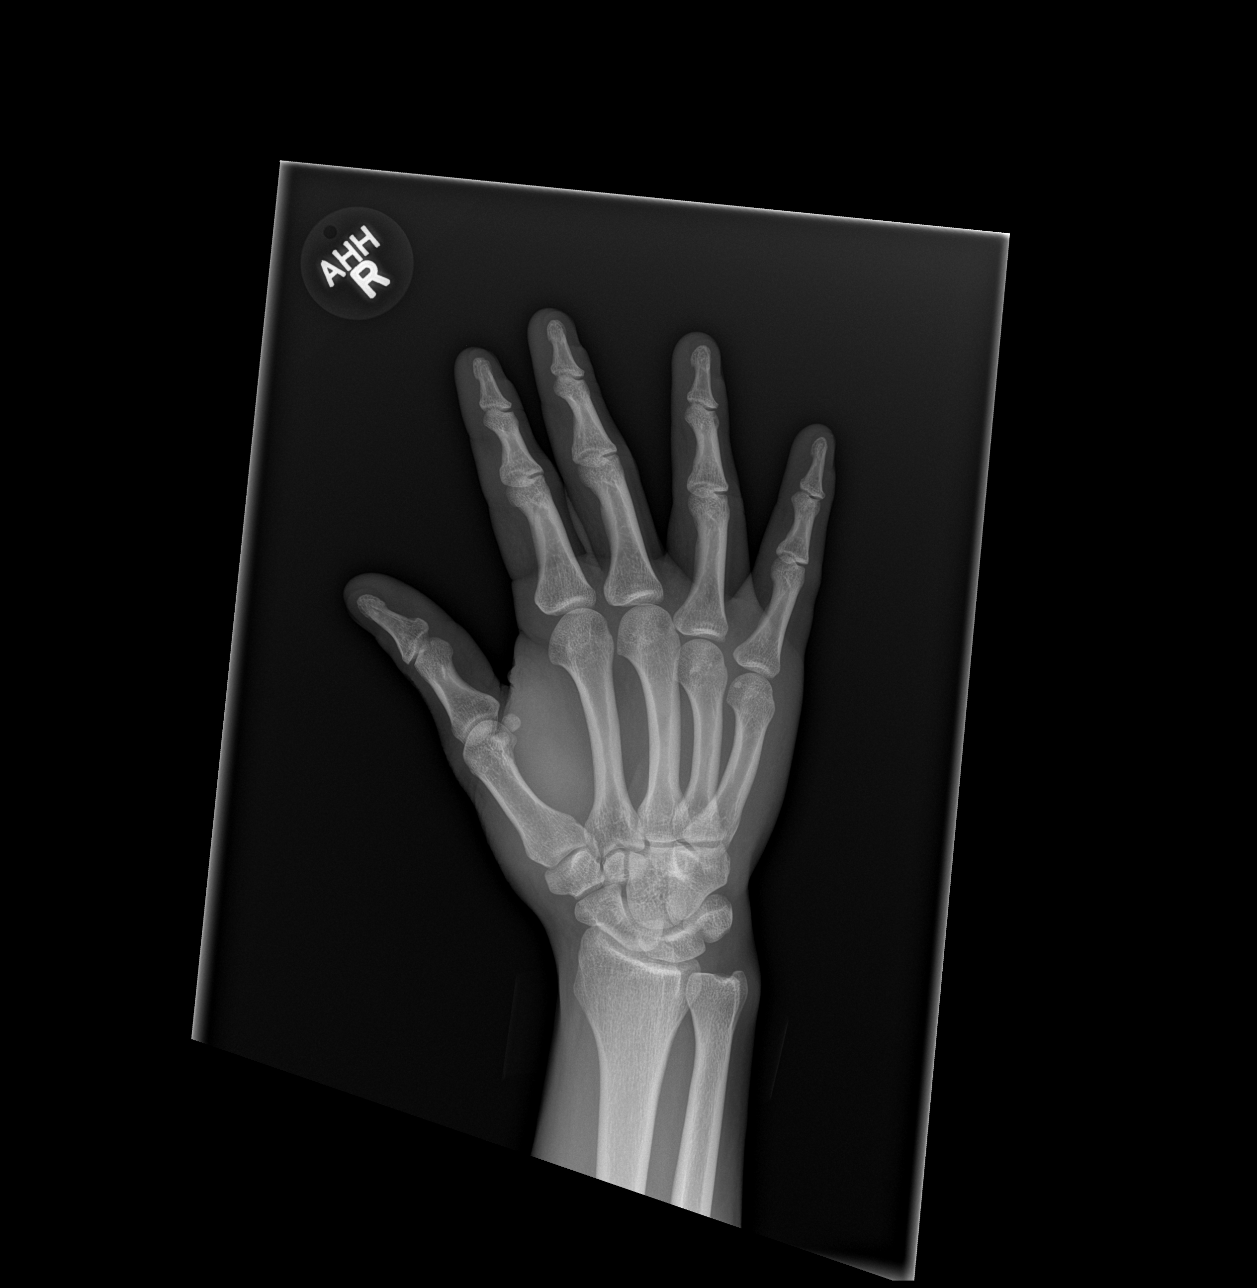

[x hand lat right]
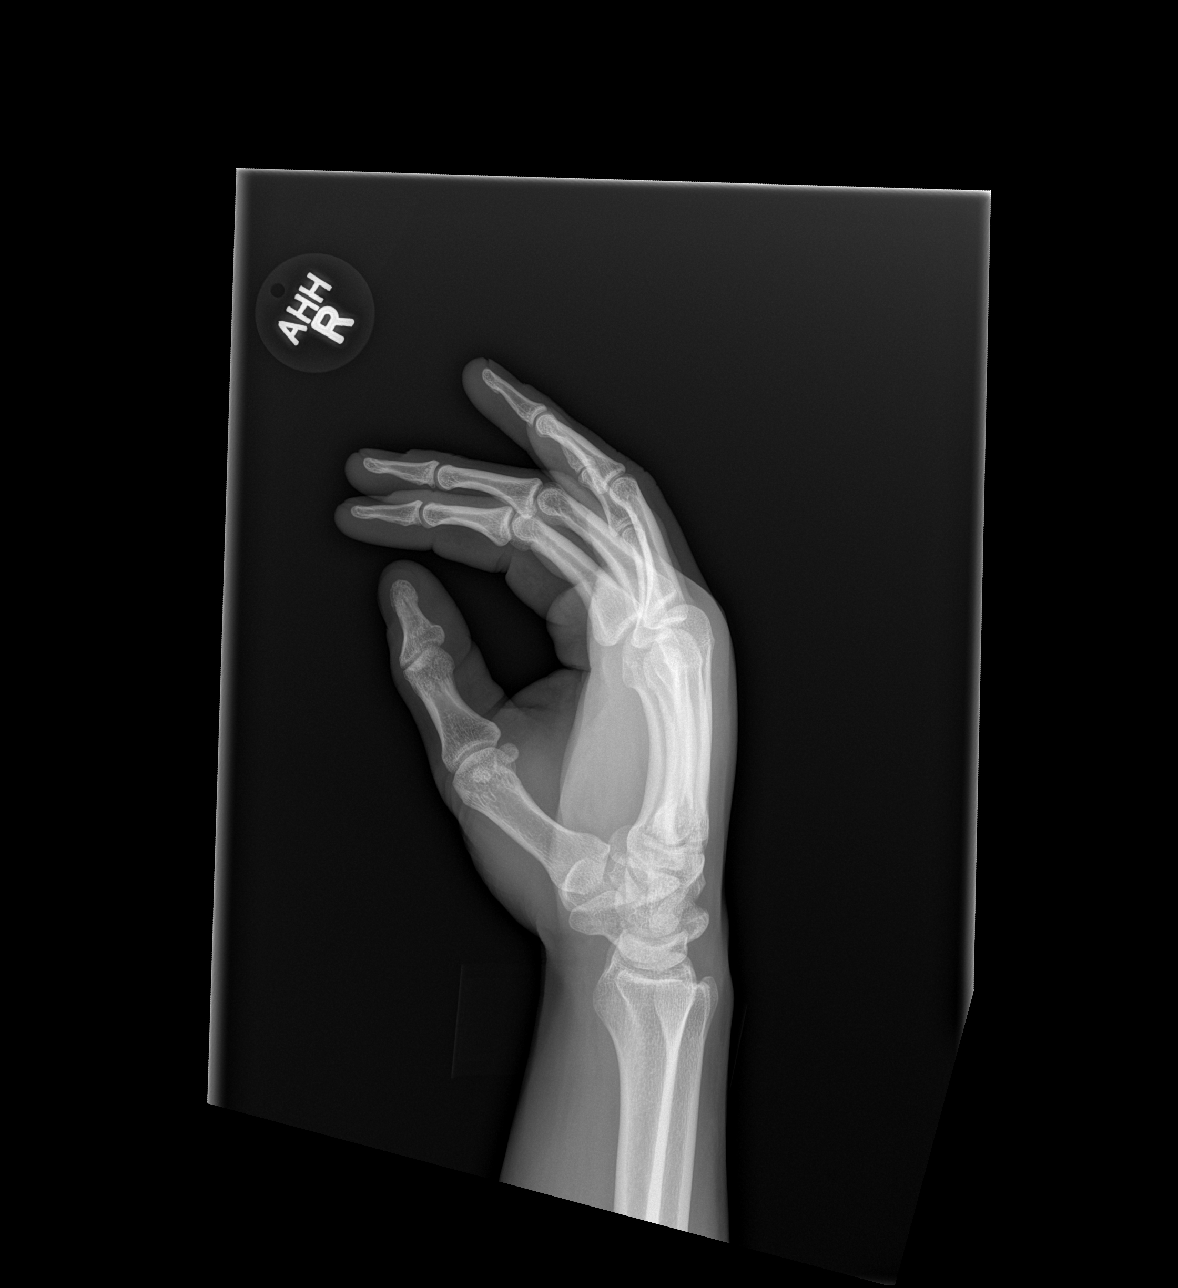

[3 of 3 positions shown; findings below may reference images not displayed]

FINDINGS: There is no evidence of fracture or dislocation. There is no
evidence of arthropathy or other focal bone abnormality. Soft
tissues are unremarkable.
IMPRESSION: Negative.

## 2017-03-25 ENCOUNTER — Emergency Department (HOSPITAL_COMMUNITY)
Admission: EM | Admit: 2017-03-25 | Discharge: 2017-03-25 | Disposition: A | Discharge status: Home / Self Care | Payer: Self-pay | Attending: Emergency Medicine | Admitting: Emergency Medicine

## 2017-03-25 ENCOUNTER — Encounter (HOSPITAL_COMMUNITY): Payer: Self-pay

## 2017-03-25 DIAGNOSIS — B029 Zoster without complications: Secondary | ICD-10-CM | POA: Insufficient documentation

## 2017-03-25 DIAGNOSIS — R21 Rash and other nonspecific skin eruption: Secondary | ICD-10-CM | POA: Insufficient documentation

## 2017-03-25 DIAGNOSIS — F17210 Nicotine dependence, cigarettes, uncomplicated: Secondary | ICD-10-CM | POA: Insufficient documentation

## 2017-03-25 MED ORDER — VALACYCLOVIR HCL 1 G PO TABS: 1000.0000 mg | ORAL_TABLET | Freq: Three times a day (TID) | ORAL | 0 refills | Status: AC

## 2017-03-25 MED ORDER — GABAPENTIN 300 MG PO CAPS: 300.0000 mg | ORAL_CAPSULE | Freq: Three times a day (TID) | ORAL | 0 refills | Status: AC

## 2017-03-25 MED ORDER — PREDNISONE 10 MG PO TABS: 40.0000 mg | ORAL_TABLET | Freq: Every day | ORAL | 0 refills | Status: AC

## 2017-03-25 NOTE — ED Triage Notes (Signed)
Patient states he felt a bite on his right mid abdomen and slapped his shirt. Patient states that he saw a raised area that night. Patient states he feels like somethng is crawling under his skin.

## 2017-03-25 NOTE — Discharge Instructions (Signed)
Given your past medical history and physical exam findings today I suspect you have shingles. Although you may have been stung or bit by an insect, and allergic rash is typically very itchy and your rash is not.  A typical shingles rash is burning, stinging with lesions that burst, ooze and crust over. The most common area of shingles rash is in the trunk.   Treatment of shingles includes antiviral medications, steroids and anti-inflammatory medications. Take ibuprofen or tylenol for pain. Gabapentin is a nerve pain medication, take it for severe burning, stinging pain. Your rash may initially worsen after starting medications however should resolve by the end of your medication regimen. Please read attached information on shingles. It is very rare but possible to have complicated shingles which can affect your eyes and nervous system. Return to the emergency department if he have any new eye symptoms including visual changes, eye pain, lesions on face.  Monitor for signs of superimposed bacterial infection (cellulitis) including increased redness, warmth, pain, discharge, fevers. Read attached information on cellulitis so you know what symptoms to monitor for that would warrant return to the emergency department.   Patients with herpes zoster can transmit varicella zoster virus (VZV) to individuals who have not had varicella and have not received the varicella vaccine. VZV is transmitted from person to person by direct contact or by aerosolization of virus from skin lesions. In general, VZV is much less transmissible from a person presenting with herpes zoster than from a person presenting with varicella. (See "Epidemiology and pathogenesis of varicella-zoster virus infection: Herpes zoster", section on 'Transmission'.)  Patients with localized zoster are not infectious before vesicles appear and are no longer infectious when the lesions have crusted over. For those with active lesions, there are no specific  precautions within the community setting. However, we recommend: Keep the rash covered, if feasible, and to wash their hands often to prevent the spread of virus to others. Avoid contact with pregnant women who have never had chickenpox or the varicella vaccine, premature or low birth weight infants, and immunocompromised individuals (cancer patients, dialysis patient, older patients, very young patients)

## 2017-03-25 NOTE — ED Notes (Signed)
Pt concerned about when he should return to work PA made aware

## 2017-03-25 NOTE — ED Notes (Signed)
ED Provider at bedside. 

## 2017-03-25 NOTE — ED Provider Notes (Signed)
[facility]-EMERGENCY DEPT Provider Note   CSN: [id number] Arrival date & time: 03/25/17  1212     History   Chief Complaint Chief Complaint  Patient presents with  . Insect Bite    HPI Aaron Vaughn is a 26 y.o. male Presents to the ED for evaluation of rash onset 3 days ago. Patient states he felt like something stung him on his abdomen, he slapped it thinking it was just a bug. Over the course of 3 days patient has noted increasing bumps to this area. Associated with burning and stinging sensation. No itching. Has history of chickenpox. Denies shingles in the past. Denies fevers, malaise, URI symptoms, abdominal pain. Is otherwise feeling at his baseline. No tongue or facial swelling, difficulty breathing. 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   HPI  Past Medical History:  Diagnosis Date  . Hernia     There are no active problems to display for this patient.   Past Surgical History:  Procedure Laterality Date  . INGUINAL HERNIA REPAIR         Home Medications    Prior to Admission medications   Medication Sig Start Date End Date Taking? Authorizing Provider  EPINEPHrine (EPIPEN) 0.3 mg/0.3 mL DEVI Inject 0.3 mg into the muscle once.    [provider]  fluticasone (FLONASE) 50 MCG/ACT nasal spray Place 2 sprays into the nose daily.    [provider]  gabapentin (NEURONTIN) 300 MG capsule Take 1 capsule (300 mg total) by mouth 3 (three) times daily. 03/25/17 03/30/17  Liberty HandyGibbons, Claudia J, PA-C  HYDROcodone-acetaminophen (NORCO) 7.5-325 MG per tablet Take 1 tablet by mouth every 6 (six) hours as needed.    [provider]  olopatadine (PATANOL) 0.1 % ophthalmic solution Place 1 drop into both eyes 2 (two) times daily.    [provider]  predniSONE (DELTASONE) 10 MG tablet Take 4 tablets (40 mg total) by mouth daily. 03/25/17 03/30/17  Liberty HandyGibbons, Claudia J,  PA-C  valACYclovir (VALTREX) 1000 MG tablet Take 1 tablet (1,000 mg total) by mouth 3 (three) times daily. 03/25/17 04/08/17  Liberty HandyGibbons, Claudia J, PA-C    Family History Family History  Problem Relation Age of Onset  . Diabetes Father   . Hypertension Father     Social History Social History  Substance Use Topics  . Smoking status: Current Every Day Smoker    Packs/day: 0.15    Types: Cigarettes  . Smokeless tobacco: Never Used  . Alcohol use No     Allergies   Bee venom   Review of Systems Review of Systems  Constitutional: Negative for fever.  HENT: Negative for congestion and sore throat.   Respiratory: Negative for chest tightness and shortness of breath.   Cardiovascular: Negative for chest pain.  Gastrointestinal: Negative for abdominal pain.  Musculoskeletal: Negative for joint swelling.  Skin: Positive for rash.  Neurological: Negative for headaches.     Physical Exam Updated Vital Signs BP 121/69 (BP Location: Right Arm)   Pulse 61   Temp 98.2 F (36.8 C) (Oral)   Resp 18   Ht 5\' 5"  (1.651 m)   Wt 63.5 kg (140 lb)   SpO2 100%   BMI 23.30 kg/m   Physical Exam  Constitutional: He is oriented to person, place, and time. He appears well-developed and well-nourished. No distress.  NAD.  HENT:  Head: Normocephalic and atraumatic.  Right Ear: External ear normal.  Left Ear: External ear normal.  Nose: Nose normal.  Eyes: Conjunctivae and EOM are normal. No scleral icterus.  No perioral or intraoral lesions  Neck: Normal range of motion. Neck supple.  Cardiovascular: Normal rate, regular rhythm, normal heart sounds and intact distal pulses.   No murmur heard. Pulmonary/Chest: Effort normal and breath sounds normal. He has no wheezes.  Abdominal: Soft. There is no tenderness.  Musculoskeletal: Normal range of motion. He exhibits no deformity.  Neurological: He is alert and oriented to person, place, and time.  Skin: Skin is warm and dry. Capillary  refill takes less than 2 seconds. Rash noted.  Grouped erythematous papules and vesicles with crusting over them to the right rib cage. Nontender. Does not cross midline. Isolated erythematous papule to right thoracic spine.  Psychiatric: He has a normal mood and affect. His behavior is normal. Judgment and thought content normal.  Nursing note and vitals reviewed.      ED Treatments / Results  Labs (all labs ordered are listed, but only abnormal results are displayed) Labs Reviewed - No data to display  EKG  EKG Interpretation None       Radiology No results found.  Procedures Procedures (including critical care time)  Medications Ordered in ED Medications - No data to display   Initial Impression / Assessment and Plan / ED Course  I have reviewed the triage vital signs and the nursing notes.  Pertinent labs & imaging results that were available during my care of the patient were reviewed by me and considered in my medical decision making (see chart for details).    26 year old male with history of chickenpox presents to the ED for evaluation of erythematous, burning, papular and vesicular rash to right anterior rib cage 3 days. Patient initially thought he felt a stinging sensation to his abdomen prior to rash onset, he thought this may have been an insect sting however he never visualized an insect. Since rash has worsened, now multiple lesions. History and exam is not consistent with urticaria or insect bite. He has documented anaphylaxis reaction to bee venom, but clinical suspicion is low for this.Rash is in a dermatomal distribution, with vesicles, burning and nonpruritic. With history of chickenpox I think it's reasonable to start treatment for shingles. Patient is within the 72 hour recommended therapy. We will discharge home with Valtrex, prednisone and gabapentin. Patient is aware of herpes zoster complications and is aware of symptoms and would warrant return to the  ED for reevaluation. Pt has epipen at home.  Patient, ED treatment and discharge plan was discussed with supervising physician who is agreeable with plan.   Final Clinical Impressions(s) / ED Diagnoses   Final diagnoses:  Rash  Herpes zoster without complication    New Prescriptions New Prescriptions   GABAPENTIN (NEURONTIN) 300 MG CAPSULE    Take 1 capsule (300 mg total) by mouth 3 (three) times daily.   PREDNISONE (DELTASONE) 10 MG TABLET    Take 4 tablets (40 mg total) by mouth daily.   VALACYCLOVIR (VALTREX) 1000 MG TABLET    Take 1 tablet (1,000 mg total) by mouth 3 (three) times daily.     Liberty HandyGibbons, Claudia J, PA-C 03/25/17 1346    Charlynne PanderYao, David Hsienta, MD 03/25/17 [phone]

## 2017-12-18 ENCOUNTER — Encounter (HOSPITAL_COMMUNITY): Payer: Self-pay | Admitting: Emergency Medicine

## 2017-12-18 ENCOUNTER — Emergency Department (HOSPITAL_COMMUNITY)
Admission: EM | Admit: 2017-12-18 | Discharge: 2017-12-18 | Disposition: A | Discharge status: Home / Self Care | Payer: Self-pay | Attending: Emergency Medicine | Admitting: Emergency Medicine

## 2017-12-18 DIAGNOSIS — G8929 Other chronic pain: Secondary | ICD-10-CM | POA: Insufficient documentation

## 2017-12-18 DIAGNOSIS — M25512 Pain in left shoulder: Secondary | ICD-10-CM | POA: Insufficient documentation

## 2017-12-18 DIAGNOSIS — F17210 Nicotine dependence, cigarettes, uncomplicated: Secondary | ICD-10-CM | POA: Insufficient documentation

## 2017-12-18 NOTE — Discharge Instructions (Addendum)
Ibuprofen (aleve, advil) 600 mg and/or acetaminophen (tylenol) 1000 mg every 6-8 hours for the next 2 days.  Rest. Ice. Avoid activities that exacerbate the pain for the next 2 days.  Slowly transition back to regular activities.

## 2017-12-18 NOTE — ED Provider Notes (Signed)
[facility]-EMERGENCY DEPT Provider Note   CSN: [id number] Arrival date & time: 12/18/17  1453     History   Chief Complaint Chief Complaint  Patient presents with  . Shoulder Pain    HPI Aaron Vaughn is a 27 y.o. male here for evaluation of gradually worsening left shoulder pain x 2 days.  Atraumatic. Reports h/o left shoulder "strain" while playing basketball several years ago and this joint gives him problems every now and then. He works at a [employer] and reports two days ago he had to use his t-shirt and use it as a sling.  Has taken ibuprofen with minimal relief.  Denies recent trauma or injury, numbness or tingling. He is RHD. Aggravating factors: lifting heavy objects.   HPI  Past Medical History:  Diagnosis Date  . Hernia     There are no active problems to display for this patient.   Past Surgical History:  Procedure Laterality Date  . INGUINAL HERNIA REPAIR          Home Medications    Prior to Admission medications   Medication Sig Start Date End Date Taking? Authorizing Provider  EPINEPHrine (EPIPEN) 0.3 mg/0.3 mL DEVI Inject 0.3 mg into the muscle once.    [provider]  fluticasone (FLONASE) 50 MCG/ACT nasal spray Place 2 sprays into the nose daily.    [provider]  gabapentin (NEURONTIN) 300 MG capsule Take 1 capsule (300 mg total) by mouth 3 (three) times daily. 03/25/17 03/30/17  Liberty Handy, PA-C  HYDROcodone-acetaminophen (NORCO) 7.5-325 MG per tablet Take 1 tablet by mouth every 6 (six) hours as needed.    [provider]  olopatadine (PATANOL) 0.1 % ophthalmic solution Place 1 drop into both eyes 2 (two) times daily.    [provider]    Family History Family History  Problem Relation Age of Onset  . Diabetes Father   . Hypertension Father     Social History Social History   Tobacco Use  . Smoking status: Current Every Day Smoker    Packs/day: 0.15     Types: Cigarettes  . Smokeless tobacco: Never Used  Substance Use Topics  . Alcohol use: No  . Drug use: No     Allergies   Bee venom   Review of Systems Review of Systems  Musculoskeletal: Positive for arthralgias.  All other systems reviewed and are negative.    Physical Exam Updated Vital Signs BP 126/87 (BP Location: Right Arm)   Pulse (!) 58   Temp 97.7 F (36.5 C) (Oral)   Resp 18   Ht  (1.651 m)   Wt 63.5 kg (140 lb)   SpO2 100%   BMI 23.30 kg/m   Physical Exam  Constitutional: He is oriented to person, place, and time. He appears well-developed and well-nourished.  Non-toxic appearance.  HENT:  Head: Normocephalic.  Right Ear: External ear normal.  Left Ear: External ear normal.  Nose: Nose normal.  Eyes: Conjunctivae and EOM are normal.  Neck: Full passive range of motion without pain.  Cardiovascular: Normal rate.  Pulmonary/Chest: Effort normal. No tachypnea. No respiratory distress.  Musculoskeletal: Normal range of motion. He exhibits tenderness.  TTP to left biceps groove and anterior shoulder. Full PROM of shoulder with minimal pain and no crepitus. Positive Neer's and Hawkin's. Positive Speed's test. No obvious skin abnormalities including abrasions, ecchymosis, erythema, edema No point tenderness to sternum, anterior chest wall, scapula, clavicle, AC or  [place] joints  Neurological: He is alert and oriented to person, place, and time.  Skin: Skin is warm and dry. Capillary refill takes less than 2 seconds.  Psychiatric: His behavior is normal. Thought content normal.     ED Treatments / Results  Labs (all labs ordered are listed, but only abnormal results are displayed) Labs Reviewed - No data to display  EKG None  Radiology No results found.  Procedures Procedures (including critical care time)  Medications Ordered in ED Medications - No data to display   Initial Impression / Assessment and Plan / ED Course  I have reviewed  the triage vital signs and the nursing notes.  Pertinent labs & imaging results that were available during my care of the patient were reviewed by me and considered in my medical decision making (see chart for details).    27 y.o. year old male with h/o significant for previous left shoulder strain presents for atraumatic left shoulder pain.  He does heavy lifting at work.  No fevers or chills. No h/o IVDU or immunosuppression. Not a prosthetic joint. No preceding trauma. On exam, there is reproducible TTP at biceps groove and findings most c/w tendonitis or MSK overuse injury. No warmth, fluctuance or evidence of overlaying cellulitis. Only mild pain with full ROM. Initial differential diagnosis includes septic arthritis, reactive arthritis, gout, however higher suspicion for OA or soft tissue injury. Will ice, elevate, treat pain and sling.  Doubt bony injury or fracture. X-ray not thought to be indicated today.    Final Clinical Impressions(s) / ED Diagnoses   Final diagnoses:  Chronic left shoulder pain    ED Discharge Orders    None       Jerrell Mylar 12/18/17 1812    Rolland Porter, MD 12/24/17 [phone]

## 2017-12-18 NOTE — ED Triage Notes (Signed)
Pt repots that he injured left shoulder before and in the past couple days pain has come back. Denies and falls or injuries.

## 2019-07-17 ENCOUNTER — Other Ambulatory Visit: Payer: Self-pay

## 2019-07-17 DIAGNOSIS — Z20822 Contact with and (suspected) exposure to covid-19: Secondary | ICD-10-CM

## 2019-07-19 LAB — NOVEL CORONAVIRUS, NAA: SARS-CoV-2, NAA: NOT DETECTED

## 2021-03-20 ENCOUNTER — Other Ambulatory Visit: Payer: Self-pay

## 2021-03-20 NOTE — ED Notes (Signed)
No answer when called for triage 

## 2021-03-21 ENCOUNTER — Emergency Department (HOSPITAL_COMMUNITY): Admission: EM | Admit: 2021-03-21 | Discharge: 2021-03-21 | Discharge status: Against Medical Advice | Payer: Self-pay

## 2021-03-21 NOTE — ED Notes (Signed)
No answer when called for triage x2 

## 2021-03-22 ENCOUNTER — Other Ambulatory Visit: Payer: Self-pay

## 2021-03-22 ENCOUNTER — Ambulatory Visit
Admission: EM | Admit: 2021-03-22 | Discharge: 2021-03-22 | Disposition: A | Discharge status: Home / Self Care | Payer: Self-pay | Attending: Physician Assistant | Admitting: Physician Assistant

## 2021-03-22 ENCOUNTER — Encounter: Payer: Self-pay | Admitting: Emergency Medicine

## 2021-03-22 DIAGNOSIS — S0501XA Injury of conjunctiva and corneal abrasion without foreign body, right eye, initial encounter: Secondary | ICD-10-CM

## 2021-03-22 MED ORDER — ERYTHROMYCIN 5 MG/GM OP OINT: TOPICAL_OINTMENT | OPHTHALMIC | 0 refills | Status: AC

## 2021-03-22 NOTE — ED Triage Notes (Addendum)
Ran into tree branch with right eye two days prior. Able to see out of eye. Eye is currently watering, very red, painful, sclera pink and irritated

## 2021-03-22 NOTE — ED Provider Notes (Signed)
[name]    CSN: [id number] Arrival date & time: 03/22/21  1232      History   Chief Complaint Chief Complaint  Patient presents with   Eye Injury    HPI Aaron Vaughn is a 30 y.o. male.   Patient presents today with a several day history of right eye irritation and pain.  Patient reports that he treated him in the right eye and he has had pain and irritation since that time.  He does not wear glasses or contacts.  He has been using allergy eyedrops and Visine without improvement of symptoms.  Reports mild ocular pain which he describes as irritation.  Denies any foreign body sensation.  He is able to see out of this eye but reports that his vision is blurred.  He denies any headache, severe ocular pain, nausea, vomiting.   Past Medical History:  Diagnosis Date   Hernia     There are no problems to display for this patient.   Past Surgical History:  Procedure Laterality Date   INGUINAL HERNIA REPAIR         Home Medications    Prior to Admission medications   Medication Sig Start Date End Date Taking? Authorizing Provider  erythromycin ophthalmic ointment Place a 1/2 inch ribbon of ointment into the lower eyelid. 03/22/21  Yes Jaben Benegas K, PA-C  EPINEPHrine (EPIPEN) 0.3 mg/0.3 mL DEVI Inject 0.3 mg into the muscle once.    [provider]  fluticasone (FLONASE) 50 MCG/ACT nasal spray Place 2 sprays into the nose daily.    [provider]  gabapentin (NEURONTIN) 300 MG capsule Take 1 capsule (300 mg total) by mouth 3 (three) times daily. 03/25/17 03/30/17  Liberty Handy, PA-C  HYDROcodone-acetaminophen (NORCO) 7.5-325 MG per tablet Take 1 tablet by mouth every 6 (six) hours as needed.    [provider]  olopatadine (PATANOL) 0.1 % ophthalmic solution Place 1 drop into both eyes 2 (two) times daily.    [provider]    Family History Family History  Problem Relation Age of Onset   Diabetes Father     Hypertension Father     Social History Social History   Tobacco Use   Smoking status: Every Day    Packs/day: 0.15    Types: Cigarettes   Smokeless tobacco: Never  Vaping Use   Vaping Use: Never used  Substance Use Topics   Alcohol use: No   Drug use: No     Allergies   Bee venom   Review of Systems Review of Systems  Constitutional:  Negative for activity change, appetite change, fatigue and fever.  Eyes:  Positive for photophobia, pain, discharge, redness and visual disturbance. Negative for itching.  Respiratory:  Negative for cough and shortness of breath.   Cardiovascular:  Negative for chest pain.  Gastrointestinal:  Negative for abdominal pain, diarrhea, nausea and vomiting.  Neurological:  Negative for dizziness, light-headedness and headaches.    Physical Exam Triage Vital Signs ED Triage Vitals [03/22/21 1349]  Enc Vitals Group     BP 131/76     Pulse Rate 81     Resp 16     Temp 98.8 F (37.1 C)     Temp Source Oral     SpO2 97 %     Weight      Height      Head Circumference      Peak Flow      Pain Score  0     Pain Loc      Pain Edu?      Excl. in GC?    No data found.  Updated Vital Signs BP 131/76 (BP Location: Left Arm)   Pulse 81   Temp 98.8 F (37.1 C) (Oral)   Resp 16   SpO2 97%   Visual Acuity Right Eye Distance: 20/40 Left Eye Distance: 20/20 Bilateral Distance: 20/20  Right Eye Near:   Left Eye Near:    Bilateral Near:     Physical Exam Vitals reviewed.  Constitutional:      General: He is awake.     Appearance: Normal appearance. He is normal weight. He is not ill-appearing.     Comments: Very pleasant male appears stated age no acute distress  HENT:     Head: Normocephalic and atraumatic.     Right Ear: Tympanic membrane, ear canal and external ear normal. Tympanic membrane is not erythematous or bulging.     Left Ear: Tympanic membrane, ear canal and external ear normal. Tympanic membrane is not erythematous or  bulging.     Nose: Nose normal.     Mouth/Throat:     Pharynx: Uvula midline. No oropharyngeal exudate or posterior oropharyngeal erythema.  Eyes:     Extraocular Movements: Extraocular movements intact.     Conjunctiva/sclera:     Right eye: Right conjunctiva is injected.     Left eye: Left conjunctiva is not injected.     Pupils: Pupils are equal, round, and reactive to light.     Right eye: Corneal abrasion present. No fluorescein uptake.     Comments: Patient had improvement of symptoms with application of tetracaine which also improved vision.  Cardiovascular:     Rate and Rhythm: Normal rate and regular rhythm.     Heart sounds: No murmur heard. Pulmonary:     Effort: Pulmonary effort is normal. No accessory muscle usage or respiratory distress.     Breath sounds: Normal breath sounds. No stridor. No wheezing, rhonchi or rales.  Abdominal:     General: Bowel sounds are normal.     Palpations: Abdomen is soft.     Tenderness: There is no abdominal tenderness.  Lymphadenopathy:     Head:     Right side of head: No submental, submandibular or tonsillar adenopathy.     Left side of head: No submental, submandibular or tonsillar adenopathy.     Cervical: No cervical adenopathy.  Neurological:     Mental Status: He is alert.  Psychiatric:        Behavior: Behavior is cooperative.     UC Treatments / Results  Labs (all labs ordered are listed, but only abnormal results are displayed) Labs Reviewed - No data to display  EKG   Radiology No results found.  Procedures Procedures (including critical care time)  Medications Ordered in UC Medications - No data to display  Initial Impression / Assessment and Plan / UC Course  I have reviewed the triage vital signs and the nursing notes.  Pertinent labs & imaging results that were available during my care of the patient were reviewed by me and considered in my medical decision making (see chart for details).       Corneal abrasion noted on exam.  Patient had significant improvement of symptoms with application of tetracaine.  Patient was prescribed erythromycin with instruction to wash his hands and avoid touching tip of bottle to eye in order to prevent contamination of medication.  He can use artificial tears for additional symptom relief.  He was given contact information for ophthalmologist should symptoms persist.  Discussed alarm symptoms that warrant emergent evaluation.  Strict return precautions given to which patient expressed understanding.  Final Clinical Impressions(s) / UC Diagnoses   Final diagnoses:  Abrasion of right cornea, initial encounter     Discharge Instructions      Use erythromycin twice daily in right eye.  This will make your vision blurred after applying it because of how thick the ointment is.  You can use artificial eyedrops but should not use anything that says redness relief.  Please follow-up with an eye doctor particularly if symptoms or not improving quickly.  If you have any worsening symptoms including inability to see out of his eye or increased pain you need to go to the emergency room.     ED Prescriptions     Medication Sig Dispense Auth. Provider   erythromycin ophthalmic ointment Place a 1/2 inch ribbon of ointment into the lower eyelid. 3.5 g Diann Bangerter K, PA-C      PDMP not reviewed this encounter.   Jeani Hawking, PA-C 03/22/21 1416

## 2021-03-22 NOTE — Discharge Instructions (Addendum)
Use erythromycin twice daily in right eye.  This will make your vision blurred after applying it because of how thick the ointment is.  You can use artificial eyedrops but should not use anything that says redness relief.  Please follow-up with an eye doctor particularly if symptoms or not improving quickly.  If you have any worsening symptoms including inability to see out of his eye or increased pain you need to go to the emergency room.

## 2023-04-23 ENCOUNTER — Emergency Department (HOSPITAL_COMMUNITY)
Admission: EM | Admit: 2023-04-23 | Discharge: 2023-04-23 | Disposition: A | Discharge status: Home / Self Care | Payer: Self-pay | Attending: Emergency Medicine | Admitting: Emergency Medicine

## 2023-04-23 ENCOUNTER — Emergency Department (HOSPITAL_COMMUNITY): Payer: Self-pay

## 2023-04-23 ENCOUNTER — Other Ambulatory Visit: Payer: Self-pay

## 2023-04-23 DIAGNOSIS — M25511 Pain in right shoulder: Secondary | ICD-10-CM

## 2023-04-23 DIAGNOSIS — M62838 Other muscle spasm: Secondary | ICD-10-CM

## 2023-04-23 MED ORDER — METHOCARBAMOL 500 MG PO TABS: 500.0000 mg | ORAL_TABLET | Freq: Two times a day (BID) | ORAL | 0 refills | Status: DC | PRN

## 2023-04-23 MED ORDER — IBUPROFEN 600 MG PO TABS: 600.0000 mg | ORAL_TABLET | Freq: Four times a day (QID) | ORAL | 0 refills | Status: AC | PRN

## 2023-04-23 MED ORDER — LIDOCAINE 5 % EX PTCH
1.0000 | MEDICATED_PATCH | CUTANEOUS | Status: DC
  Administered 2023-04-23: 1 via TRANSDERMAL
  Filled 2023-04-23: qty 1

## 2023-04-23 MED ORDER — IBUPROFEN 600 MG PO TABS: 600.0000 mg | ORAL_TABLET | Freq: Four times a day (QID) | ORAL | 0 refills | Status: DC | PRN

## 2023-04-23 MED ORDER — METHOCARBAMOL 500 MG PO TABS
500.0000 mg | ORAL_TABLET | Freq: Once | ORAL | Status: AC
  Administered 2023-04-23: 500 mg via ORAL
  Filled 2023-04-23: qty 1

## 2023-04-23 MED ORDER — ACETAMINOPHEN 500 MG PO TABS
1000.0000 mg | ORAL_TABLET | Freq: Once | ORAL | Status: AC
  Administered 2023-04-23: 1000 mg via ORAL
  Filled 2023-04-23: qty 2

## 2023-04-23 MED ORDER — METHOCARBAMOL 500 MG PO TABS: 500.0000 mg | ORAL_TABLET | Freq: Two times a day (BID) | ORAL | 0 refills | Status: AC | PRN

## 2023-04-23 MED ORDER — KETOROLAC TROMETHAMINE 15 MG/ML IJ SOLN
15.0000 mg | Freq: Once | INTRAMUSCULAR | Status: AC
  Administered 2023-04-23: 15 mg via INTRAMUSCULAR
  Filled 2023-04-23: qty 1
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
## 2023-04-23 NOTE — Discharge Instructions (Addendum)
You were seen here in the emerged from today for evaluation of your right shoulder.  Your x-ray shows some arthritis.  For this, I would like you to take 600 mg of ibuprofen and 1000 mg of Tylenol every 6 hours as needed for pain.  I have also prescribed you a few muscle relaxers.  Please be careful while taking this medication and do not drive or operate heavy machinery as it can make you sleepy.  You can also try over-the-counter lidocaine patches as well.  Ultimately, you will need to follow-up with an orthopedic provider.  I have included 1 to the discharge paperwork.  Please call them to schedule an appointment.  Additionally, I have included more information on shoulder pain.  Please review.  If you have any concerns, new or worsening symptoms, please return to your nearest emergency department for reevaluation.  Contact a doctor if: Your pain gets worse. Medicine does not help your pain. You have new pain in your arm, hand, or fingers. You loosen your sling and your arm, hand, or fingers: Tingle. Are numb. Are swollen. Get help right away if: Your arm, hand, or fingers turn white or blue.
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
